# Patient Record
Sex: Female | Born: 1985 | Race: White | Hispanic: No | Marital: Married | Smoking: Never smoker
Health system: Southern US, Community
[De-identification: ages and names within clinical notes are randomized; demographics above are authoritative.]

## PROBLEM LIST (undated history)

## (undated) DIAGNOSIS — G40909 Epilepsy, unspecified, not intractable, without status epilepticus: Secondary | ICD-10-CM

## (undated) DIAGNOSIS — G43109 Migraine with aura, not intractable, without status migrainosus: Secondary | ICD-10-CM

## (undated) DIAGNOSIS — F988 Other specified behavioral and emotional disorders with onset usually occurring in childhood and adolescence: Secondary | ICD-10-CM

## (undated) DIAGNOSIS — N200 Calculus of kidney: Secondary | ICD-10-CM

## (undated) DIAGNOSIS — R6889 Other general symptoms and signs: Secondary | ICD-10-CM

## (undated) DIAGNOSIS — G44009 Cluster headache syndrome, unspecified, not intractable: Secondary | ICD-10-CM

## (undated) DIAGNOSIS — R569 Unspecified convulsions: Secondary | ICD-10-CM

## (undated) DIAGNOSIS — G43909 Migraine, unspecified, not intractable, without status migrainosus: Secondary | ICD-10-CM

## (undated) HISTORY — DX: Migraine with aura, not intractable, without status migrainosus: G43.109

## (undated) HISTORY — DX: Other specified behavioral and emotional disorders with onset usually occurring in childhood and adolescence: F98.8

## (undated) HISTORY — DX: Cluster headache syndrome, unspecified, not intractable: G44.009

## (undated) HISTORY — DX: Calculus of kidney: N20.0

## (undated) HISTORY — DX: Migraine, unspecified, not intractable, without status migrainosus: G43.909

## (undated) HISTORY — DX: Other general symptoms and signs: R68.89

## (undated) HISTORY — DX: Epilepsy, unspecified, not intractable, without status epilepticus: G40.909

## (undated) HISTORY — DX: Unspecified convulsions: R56.9

---
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
## 2008-04-21 ENCOUNTER — Other Ambulatory Visit: Admission: RE | Admit: 2008-04-21 | Discharge: 2008-04-21 | Payer: Self-pay | Admitting: Family Medicine

## 2008-06-26 ENCOUNTER — Emergency Department (HOSPITAL_COMMUNITY): Admission: EM | Admit: 2008-06-26 | Discharge: 2008-06-26 | Payer: Self-pay | Admitting: Family Medicine

## 2009-05-05 ENCOUNTER — Other Ambulatory Visit: Admission: RE | Admit: 2009-05-05 | Discharge: 2009-05-05 | Payer: Self-pay | Admitting: Family Medicine

## 2010-05-31 ENCOUNTER — Other Ambulatory Visit (HOSPITAL_COMMUNITY): Payer: Self-pay | Admitting: Neurology

## 2010-05-31 DIAGNOSIS — G43009 Migraine without aura, not intractable, without status migrainosus: Secondary | ICD-10-CM

## 2010-05-31 DIAGNOSIS — R6889 Other general symptoms and signs: Secondary | ICD-10-CM

## 2010-05-31 DIAGNOSIS — G40309 Generalized idiopathic epilepsy and epileptic syndromes, not intractable, without status epilepticus: Secondary | ICD-10-CM

## 2010-06-12 ENCOUNTER — Ambulatory Visit (HOSPITAL_COMMUNITY): Payer: Self-pay

## 2010-06-12 ENCOUNTER — Ambulatory Visit (HOSPITAL_COMMUNITY)
Admission: RE | Admit: 2010-06-12 | Discharge: 2010-06-12 | Disposition: A | Discharge status: Home / Self Care | Payer: Commercial Managed Care - PPO | Source: Ambulatory Visit | Attending: Neurology | Admitting: Neurology

## 2010-06-12 DIAGNOSIS — J329 Chronic sinusitis, unspecified: Secondary | ICD-10-CM | POA: Insufficient documentation

## 2010-06-12 DIAGNOSIS — R6889 Other general symptoms and signs: Secondary | ICD-10-CM

## 2010-06-12 DIAGNOSIS — G40309 Generalized idiopathic epilepsy and epileptic syndromes, not intractable, without status epilepticus: Secondary | ICD-10-CM | POA: Insufficient documentation

## 2010-06-12 DIAGNOSIS — G43009 Migraine without aura, not intractable, without status migrainosus: Secondary | ICD-10-CM | POA: Insufficient documentation

## 2010-06-21 ENCOUNTER — Other Ambulatory Visit (HOSPITAL_COMMUNITY): Payer: Self-pay | Admitting: Neurology

## 2010-06-22 ENCOUNTER — Ambulatory Visit (HOSPITAL_COMMUNITY)
Admission: RE | Admit: 2010-06-22 | Discharge: 2010-06-22 | Disposition: A | Discharge status: Home / Self Care | Payer: Commercial Managed Care - PPO | Source: Ambulatory Visit | Attending: Neurology | Admitting: Neurology

## 2010-06-22 ENCOUNTER — Other Ambulatory Visit (HOSPITAL_COMMUNITY): Payer: Self-pay | Admitting: Neurology

## 2010-06-22 DIAGNOSIS — R569 Unspecified convulsions: Secondary | ICD-10-CM | POA: Insufficient documentation

## 2010-06-22 DIAGNOSIS — R51 Headache: Secondary | ICD-10-CM | POA: Insufficient documentation

## 2010-06-22 DIAGNOSIS — R9409 Abnormal results of other function studies of central nervous system: Secondary | ICD-10-CM | POA: Insufficient documentation

## 2010-06-22 DIAGNOSIS — Z01812 Encounter for preprocedural laboratory examination: Secondary | ICD-10-CM | POA: Insufficient documentation

## 2010-06-22 LAB — CBC
MCHC: 34.7 g/dL (ref 30.0–36.0)
RDW: 13.8 % (ref 11.5–15.5)

## 2010-06-22 LAB — POCT I-STAT, CHEM 8
BUN: 13 mg/dL (ref 6–23)
Calcium, Ion: 1.21 mmol/L (ref 1.12–1.32)
Chloride: 109 mEq/L (ref 96–112)
Creatinine, Ser: 0.9 mg/dL (ref 0.4–1.2)

## 2010-06-22 LAB — APTT: aPTT: 31 seconds (ref 24–37)

## 2010-06-22 MED ORDER — IOHEXOL 300 MG/ML  SOLN
100.0000 mL | Freq: Once | INTRAMUSCULAR | Status: AC | PRN
  Administered 2010-06-22: 40 mL via INTRAVENOUS

## 2010-06-22 MED ORDER — IOHEXOL 300 MG/ML  SOLN
50.0000 mL | Freq: Once | INTRAMUSCULAR | Status: AC | PRN
  Administered 2010-06-22: 40 mL via INTRAVENOUS

## 2010-12-14 ENCOUNTER — Encounter: Payer: Self-pay | Admitting: Vascular Surgery

## 2010-12-23 ENCOUNTER — Emergency Department (HOSPITAL_COMMUNITY)
Admission: EM | Admit: 2010-12-23 | Discharge: 2010-12-24 | Disposition: A | Discharge status: Home / Self Care | Payer: 59 | Attending: Emergency Medicine | Admitting: Emergency Medicine

## 2010-12-23 DIAGNOSIS — G40909 Epilepsy, unspecified, not intractable, without status epilepticus: Secondary | ICD-10-CM | POA: Insufficient documentation

## 2010-12-23 DIAGNOSIS — R1032 Left lower quadrant pain: Secondary | ICD-10-CM | POA: Insufficient documentation

## 2010-12-24 ENCOUNTER — Emergency Department (HOSPITAL_COMMUNITY): Payer: 59

## 2010-12-24 LAB — URINALYSIS, ROUTINE W REFLEX MICROSCOPIC
Bilirubin Urine: NEGATIVE
Glucose, UA: NEGATIVE mg/dL
Nitrite: NEGATIVE
Specific Gravity, Urine: 1.018 (ref 1.005–1.030)
pH: 7.5 (ref 5.0–8.0)

## 2010-12-24 LAB — POCT PREGNANCY, URINE: Preg Test, Ur: NEGATIVE

## 2010-12-24 LAB — URINE MICROSCOPIC-ADD ON

## 2011-02-02 ENCOUNTER — Other Ambulatory Visit: Payer: Self-pay

## 2011-02-02 DIAGNOSIS — M79609 Pain in unspecified limb: Secondary | ICD-10-CM

## 2011-02-02 DIAGNOSIS — I83893 Varicose veins of bilateral lower extremities with other complications: Secondary | ICD-10-CM

## 2011-02-05 ENCOUNTER — Encounter: Payer: Self-pay | Admitting: Vascular Surgery

## 2011-02-06 ENCOUNTER — Encounter: Payer: Commercial Managed Care - PPO | Admitting: Vascular Surgery

## 2011-03-19 ENCOUNTER — Emergency Department (HOSPITAL_COMMUNITY)
Admission: EM | Admit: 2011-03-19 | Discharge: 2011-03-19 | Disposition: A | Discharge status: Home / Self Care | Payer: 59 | Source: Home / Self Care | Attending: Family Medicine | Admitting: Family Medicine

## 2011-03-19 ENCOUNTER — Encounter (HOSPITAL_COMMUNITY): Payer: Self-pay

## 2011-03-19 DIAGNOSIS — J069 Acute upper respiratory infection, unspecified: Secondary | ICD-10-CM

## 2011-03-19 MED ORDER — GUAIFENESIN-CODEINE 100-10 MG/5ML PO SYRP: 5.0000 mL | ORAL_SOLUTION | Freq: Three times a day (TID) | ORAL | Status: AC | PRN

## 2011-03-19 MED ORDER — AZITHROMYCIN 250 MG PO TABS: ORAL_TABLET | ORAL | Status: AC

## 2011-03-19 NOTE — ED Provider Notes (Signed)
History     CSN: [id number] Arrival date & time: 03/19/2011  2:45 PM   First MD Initiated Contact with Patient 03/19/11 1434      Chief Complaint  Patient presents with  . Cough    (Consider location/radiation/quality/duration/timing/severity/associated sxs/prior treatment) Patient is a 25 y.o. female presenting with cough. The history is provided by the patient.  Cough This is a new problem. The current episode started 2 days ago. The problem occurs constantly. The problem has been gradually worsening. The cough is productive of purulent sputum. There has been no fever. Associated symptoms include rhinorrhea and sore throat. She has tried nothing for the symptoms. She is not a smoker.    Past Medical History  Diagnosis Date  . Headaches, cluster   . Seizure disorder     History reviewed. No pertinent past surgical history.  History reviewed. No pertinent family history.  History  Substance Use Topics  . Smoking status: Never Smoker   . Smokeless tobacco: Not on file  . Alcohol Use: Yes    OB History    Grav Para Term Preterm Abortions TAB SAB Ect Mult Living                  Review of Systems  Constitutional: Negative.   HENT: Positive for congestion, sore throat, rhinorrhea and postnasal drip.   Respiratory: Positive for cough.     Allergies  Review of patient's allergies indicates no known allergies.  Home Medications   Current Outpatient Rx  Name Route Sig Dispense Refill  . TOPIRAMATE 100 MG PO TABS Oral Take 100 mg by mouth 2 (two) times daily.        BP 106/73  Pulse 68  Temp(Src) 98.2 F (36.8 C) (Oral)  Resp 10  SpO2 100%  Physical Exam  Constitutional: She appears well-developed and well-nourished.  HENT:  Head: Normocephalic.  Right Ear: External ear normal.  Left Ear: External ear normal.  Mouth/Throat: Oropharynx is clear and moist.  Eyes: Conjunctivae and EOM are normal. Pupils are equal, round, and reactive to light.  Neck:  Normal range of motion. Neck supple.  Cardiovascular: Normal rate, regular rhythm, normal heart sounds and intact distal pulses.   Pulmonary/Chest: Breath sounds normal.  Lymphadenopathy:    She has cervical adenopathy.  Skin: Skin is warm and dry.    ED Course  Procedures (including critical care time)  Labs Reviewed - No data to display No results found.   No diagnosis found.    MDM          Barkley Bruns, MD 03/19/11 [phone]

## 2011-03-19 NOTE — ED Notes (Signed)
C/o she has been coughing for 24-46 hours ; green-brown phlegm, minimal relief w OTC meds (zicam, cephacol, motrin)

## 2011-07-29 ENCOUNTER — Emergency Department (HOSPITAL_COMMUNITY)
Admission: EM | Admit: 2011-07-29 | Discharge: 2011-07-29 | Disposition: A | Discharge status: Home / Self Care | Payer: 59 | Source: Home / Self Care

## 2011-07-29 ENCOUNTER — Encounter (HOSPITAL_COMMUNITY): Payer: Self-pay

## 2011-07-29 DIAGNOSIS — R109 Unspecified abdominal pain: Secondary | ICD-10-CM

## 2011-07-29 LAB — POCT URINALYSIS DIP (DEVICE)
Leukocytes, UA: NEGATIVE
Nitrite: NEGATIVE
Protein, ur: NEGATIVE mg/dL
Urobilinogen, UA: 0.2 mg/dL (ref 0.0–1.0)
pH: 6.5 (ref 5.0–8.0)

## 2011-07-29 LAB — WET PREP, GENITAL

## 2011-07-29 MED ORDER — HYDROCODONE-ACETAMINOPHEN 5-325 MG PO TABS: 1.0000 | ORAL_TABLET | Freq: Four times a day (QID) | ORAL | Status: AC | PRN

## 2011-07-29 MED ORDER — HYDROXYZINE HCL 25 MG PO TABS: 25.0000 mg | ORAL_TABLET | Freq: Four times a day (QID) | ORAL | Status: AC | PRN

## 2011-07-29 NOTE — ED Provider Notes (Signed)
History     CSN: [id number]  Arrival date & time 07/29/11  7829   None     Chief Complaint  Patient presents with  . Abdominal Pain    2 day hx of abdominal pain.  Have had diarrhea x2.  LLQ pain at first now has moved below navel.      (Consider location/radiation/quality/duration/timing/severity/associated sxs/prior treatment) HPI Comments: Gradual onset abd pain on 07/27/11.  Was initially localized to LLQ.  Over time has gradually spread to entire middle/transverse abd across umbilicus.  Cannot localize at this time.  Is finished menses now, period was normal for pt, still has brown spotting.  No unusual vag bleeding per pt's usual.  One episode diarrhea yesterday; episode of diarrhea today was mucous-y.  No hx abd surgery.  No fever, no emesis.   Patient is a 26 y.o. female presenting with abdominal pain. The history is provided by the patient.  Abdominal Pain The primary symptoms of the illness include abdominal pain and diarrhea. The primary symptoms of the illness do not include fever, shortness of breath, nausea, vomiting, dysuria or vaginal discharge. The current episode started 2 days ago. The onset of the illness was gradual. The problem has been gradually worsening.  The abdominal pain is located in the periumbilical region, LLQ and RLQ. The abdominal pain does not radiate. The severity of the abdominal pain is 3/10 (pain is 2-3 when at rest, 10 with gentle palpation). The abdominal pain is relieved by nothing.  The diarrhea began yesterday. The diarrhea is watery and mucous. The diarrhea occurs once per day.  The patient states that she believes she is currently not pregnant. Additional symptoms associated with the illness include back pain. Symptoms associated with the illness do not include chills or diaphoresis. Significant associated medical issues do not include inflammatory bowel disease, diabetes, gallstones or diverticulitis.    Past Medical History  Diagnosis Date  .  Headaches, cluster   . Seizure disorder     History reviewed. No pertinent past surgical history.  Family History  Problem Relation Age of Onset  . Cancer Other   . Heart failure Other     History  Substance Use Topics  . Smoking status: Never Smoker   . Smokeless tobacco: Never Used  . Alcohol Use: Yes     occational    OB History    Grav Para Term Preterm Abortions TAB SAB Ect Mult Living                  Review of Systems  Constitutional: Negative for fever, chills, diaphoresis and appetite change.  Respiratory: Negative for shortness of breath.   Gastrointestinal: Positive for abdominal pain and diarrhea. Negative for nausea, vomiting and blood in stool.  Genitourinary: Negative for dysuria, flank pain, vaginal discharge, vaginal pain and pelvic pain.  Musculoskeletal: Positive for back pain.       Slight low back pain since the abd pain began  Skin: Negative.     Allergies  Review of patient's allergies indicates no known allergies.  Home Medications   Current Outpatient Rx  Name Route Sig Dispense Refill  . HYDROCODONE-ACETAMINOPHEN 5-325 MG PO TABS Oral Take 1-2 tablets by mouth every 6 (six) hours as needed for pain. 20 tablet 0  . HYDROXYZINE HCL 25 MG PO TABS Oral Take 1 tablet (25 mg total) by mouth every 6 (six) hours as needed for itching. 20 tablet 0  . TOPIRAMATE 100 MG PO TABS Oral Take 100  mg by mouth 2 (two) times daily.        BP 134/79  Pulse 82  Temp(Src) 98.3 F (36.8 C) (Oral)  Resp 14  SpO2 100%  LMP 07/21/2011  Physical Exam  Constitutional: She appears well-developed and well-nourished. No distress.  Cardiovascular: Normal rate and regular rhythm.   Pulmonary/Chest: Effort normal and breath sounds normal.  Abdominal: Normal appearance and bowel sounds are normal. There is no hepatosplenomegaly. There is tenderness in the right lower quadrant, periumbilical area and left lower quadrant. There is no rigidity, no rebound, no guarding,  no CVA tenderness, no tenderness at McBurney's point and negative Murphy's sign.    Genitourinary: Cervix exhibits no motion tenderness. Right adnexum displays no mass and no tenderness. Left adnexum displays no mass and no tenderness. No tenderness around the vagina.       Small amount mucous-y, brown/old blood discharge from cervix c/w end of menses spotting/discharge. Unable to palpate retroverted uterus.     ED Course  Procedures (including critical care time)  Labs Reviewed  POCT URINALYSIS DIP (DEVICE) - Abnormal; Notable for the following:    Hgb urine dipstick SMALL (*)    All other components within normal limits  POCT PREGNANCY, URINE  GC/CHLAMYDIA PROBE AMP, GENITAL  WET PREP, GENITAL   No results found.   1. Abdominal pain       MDM  Discussed options with pt.  She is not febrile, does not appear toxic or in distress except with abd palpation.  Offered transfer to ED, or d/c home with pain medicine and f/u with pcp in the AM.  Pt elects to f/u with pcp.  Reviewed with pt reasons for going to ER. Pt verbalizes understanding. Rx hydrocodone with atarax as pt states hydrocodone makes her itch.         Cathlyn Parsons, NP 07/29/11 1025

## 2011-07-29 NOTE — Discharge Instructions (Signed)
If your symptoms worsen, you develop a fever, or your pain becomes localized, go to the ER. Otherwise, follow up with Dr. Parke Simmers tomorrow for further testing.    Abdominal Pain Abdominal pain can be caused by many things. Your caregiver decides the seriousness of your pain by an examination and possibly blood tests and X-rays. Many cases can be observed and treated at home. Most abdominal pain is not caused by a disease and will probably improve without treatment. However, in many cases, more time must pass before a clear cause of the pain can be found. Before that point, it may not be known if you need more testing, or if hospitalization or surgery is needed. HOME CARE INSTRUCTIONS   Do not take laxatives unless directed by your caregiver.   Take pain medicine only as directed by your caregiver.   Only take over-the-counter or prescription medicines for pain, discomfort, or fever as directed by your caregiver.   Try a clear liquid diet (broth, tea, or water) for as long as directed by your caregiver. Slowly move to a bland diet as tolerated.  SEEK IMMEDIATE MEDICAL CARE IF:   The pain does not go away.   You have a fever.   You keep throwing up (vomiting).   The pain is felt only in portions of the abdomen. Pain in the right side could possibly be appendicitis. In an adult, pain in the left lower portion of the abdomen could be colitis or diverticulitis.   You pass bloody or black tarry stools.  MAKE SURE YOU:   Understand these instructions.   Will watch your condition.   Will get help right away if you are not doing well or get worse.  Document Released: 01/10/2005 Document Revised: 03/22/2011 Document Reviewed: 11/19/2007 [facility] Patient Information 2012 [place], [place].

## 2011-07-29 NOTE — ED Notes (Signed)
2 day hx of abdominal pain.  Have had diarrhea x2.  LLQ pain at first now has moved below navel.  Pt. States she just finished her period, so this pain is not like cramps from period.

## 2011-07-30 ENCOUNTER — Other Ambulatory Visit: Payer: Self-pay | Admitting: Family Medicine

## 2011-07-30 ENCOUNTER — Ambulatory Visit
Admission: RE | Admit: 2011-07-30 | Discharge: 2011-07-30 | Disposition: A | Discharge status: Home / Self Care | Payer: 59 | Source: Ambulatory Visit | Attending: Family Medicine | Admitting: Family Medicine

## 2011-07-30 MED ORDER — IOHEXOL 300 MG/ML  SOLN
30.0000 mL | Freq: Once | INTRAMUSCULAR | Status: AC | PRN
  Administered 2011-07-30: 30 mL via ORAL

## 2011-07-30 MED ORDER — IOHEXOL 300 MG/ML  SOLN
100.0000 mL | Freq: Once | INTRAMUSCULAR | Status: AC | PRN
  Administered 2011-07-30: 100 mL via INTRAVENOUS

## 2011-08-06 NOTE — ED Provider Notes (Signed)
Medical screening examination/treatment/procedure(s) were performed by non-physician practitioner and as supervising physician I was immediately available for consultation/collaboration.  Eashan Schipani M. MD   Spero Gunnels M Shadrack Brummitt, MD 08/06/11 1419 

## 2012-04-15 ENCOUNTER — Encounter: Payer: Self-pay | Admitting: *Deleted

## 2012-04-15 ENCOUNTER — Emergency Department
Admission: EM | Admit: 2012-04-15 | Discharge: 2012-04-15 | Disposition: A | Discharge status: Home / Self Care | Payer: 59 | Source: Home / Self Care | Attending: Family Medicine | Admitting: Family Medicine

## 2012-04-15 DIAGNOSIS — R059 Cough, unspecified: Secondary | ICD-10-CM

## 2012-04-15 DIAGNOSIS — R509 Fever, unspecified: Secondary | ICD-10-CM

## 2012-04-15 DIAGNOSIS — R05 Cough: Secondary | ICD-10-CM

## 2012-04-15 DIAGNOSIS — J029 Acute pharyngitis, unspecified: Secondary | ICD-10-CM

## 2012-04-15 DIAGNOSIS — J111 Influenza due to unidentified influenza virus with other respiratory manifestations: Secondary | ICD-10-CM

## 2012-04-15 LAB — POCT RAPID STREP A (OFFICE): Rapid Strep A Screen: NEGATIVE

## 2012-04-15 MED ORDER — OSELTAMIVIR PHOSPHATE 75 MG PO CAPS: 75.0000 mg | ORAL_CAPSULE | Freq: Two times a day (BID) | ORAL | Status: DC

## 2012-04-15 MED ORDER — BENZONATATE 200 MG PO CAPS: 200.0000 mg | ORAL_CAPSULE | Freq: Every day | ORAL | Status: DC

## 2012-04-15 NOTE — ED Provider Notes (Signed)
History     CSN: [id number]  Arrival date & time 04/15/12  1739   First MD Initiated Contact with Patient 04/15/12 1806      Chief Complaint  Patient presents with  . Sore Throat  . Fever  . Cough     HPI Comments: Patient complains of onset of flu-like symptoms two days ago including sore throat, fever, body aches and dry cough.  No improvement with ibuprofen and mucinex.  She had a flu shot in October.  The history is provided by the patient.    Past Medical History  Diagnosis Date  . Headaches, cluster   . Seizure disorder     History reviewed. No pertinent past surgical history.  Family History  Problem Relation Age of Onset  . Cancer Other   . Heart failure Other     History  Substance Use Topics  . Smoking status: Never Smoker   . Smokeless tobacco: Never Used  . Alcohol Use: Yes     Comment: occational    OB History    Grav Para Term Preterm Abortions TAB SAB Ect Mult Living                  Review of Systems + sore throat + cough No pleuritic pain No wheezing + hoarseness + nasal congestion + post-nasal drainage No sinus pain/pressure No itchy/red eyes No earache No hemoptysis No SOB + fever, + chills No nausea No vomiting No abdominal pain No diarrhea No urinary symptoms No skin rashes + fatigue + myalgias + headache Used OTC meds without relief  Allergies  Review of patient's allergies indicates no known allergies.  Home Medications   Current Outpatient Rx  Name  Route  Sig  Dispense  Refill  . BENZONATATE 200 MG PO CAPS   Oral   Take 1 capsule (200 mg total) by mouth at bedtime. Take as needed for cough   12 capsule   0   . OSELTAMIVIR PHOSPHATE 75 MG PO CAPS   Oral   Take 1 capsule (75 mg total) by mouth every 12 (twelve) hours.   10 capsule   0   . TOPIRAMATE 100 MG PO TABS   Oral   Take 100 mg by mouth 2 (two) times daily.             BP 102/68  Pulse 84  Temp 99.1 F (37.3 C) (Oral)  Resp 16  Ht 5'  7" (1.702 m)  Wt 163 lb (73.936 kg)  BMI 25.53 kg/m2  SpO2 99%  LMP 03/25/2012  Physical Exam Nursing notes and Vital Signs reviewed. Appearance:  Patient appears healthy, stated age, and in no acute distress Eyes:  Pupils are equal, round, and reactive to light and accomodation.  Extraocular movement is intact.  Conjunctivae are not inflamed  Ears:  Canals normal.  Tympanic membranes normal.  Nose:  Mildly congested turbinates.  No sinus tenderness.   Pharynx:  Mildly erythematous Neck:  Supple.  Tender shotty anterior/posterior nodes are palpated bilaterally  Lungs:  Clear to auscultation.  Breath sounds are equal.  Heart:  Regular rate and rhythm without murmurs, rubs, or gallops.  Abdomen:  Nontender without masses or hepatosplenomegaly.  Bowel sounds are present.  No CVA or flank tenderness.  Extremities:  No edema.  No calf tenderness Skin:  No rash present.   ED Course  Procedures none   Labs Reviewed  POCT RAPID STREP A (OFFICE) negative      1.  Influenza-like illness       MDM  Begin Tamiflu.  Prescription written for Benzonatate [facility]) to take at bedtime for night-time cough.   Take plain Mucinex (guaifenesin) twice daily for cough and congestion. May add Sudafed for sinus congestion.  Increase fluid intake, rest. May use Afrin nasal spray (or generic oxymetazoline) twice daily for about 5 days.  Also recommend using saline nasal spray several times daily and saline nasal irrigation (AYR is a common brand) Stop all antihistamines for now, and other non-prescription cough/cold preparations. May take Ibuprofen 200mg , 4 tabs every 8 hours with food for fever, body aches, etc. Follow-up with family doctor if not improving about 5 days.          Lattie Haw, MD 04/15/12 [phone]

## 2012-04-15 NOTE — ED Notes (Signed)
Patient c/o sore throat, fever, body aches and dry cough x 2-3 days. Taken IBF, and mucinex otc

## 2012-08-25 ENCOUNTER — Telehealth: Payer: Self-pay | Admitting: Neurology

## 2012-08-25 MED ORDER — SUMATRIPTAN SUCCINATE 100 MG PO TABS: 100.0000 mg | ORAL_TABLET | ORAL | Status: DC | PRN

## 2012-08-25 NOTE — Telephone Encounter (Signed)
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  Tammy Cameron refills via [facility]

## 2012-08-29 ENCOUNTER — Telehealth: Payer: Self-pay | Admitting: Neurology

## 2012-09-03 ENCOUNTER — Telehealth: Payer: Self-pay | Admitting: Neurology

## 2012-09-15 ENCOUNTER — Telehealth: Payer: Self-pay | Admitting: Neurology

## 2012-10-21 ENCOUNTER — Ambulatory Visit: Payer: Self-pay | Admitting: Neurology

## 2012-11-11 IMAGING — CT CT ABD-PELV W/ CM
2 of 4 series · 17 of 46 positions shown, 19 images · IV contrast (READICAT/WATER & [ID] OMNI 300)
Comparison: [HOSPITAL] CT abdomen pelvis dated 12/24/2010

CLINICAL DATA: Right lower quadrant pain/tenderness

CT ABDOMEN AND PELVIS WITH CONTRAST
TECHNIQUE: Multidetector CT imaging of the abdomen and pelvis was
performed following the standard protocol during bolus
administration of intravenous contrast.
Contrast: 30mL OMNIPAQUE IOHEXOL 300 MG/ML  SOLN, 100mL OMNIPAQUE
IOHEXOL 300 MG/ML  SOLN

[Series 2: abd/pelvis with · axial · 0.70mm/px · z∈[-382,+18]mm · 14 of 88 slices shown, 16 images]
[im 4/88  soft-tissue]
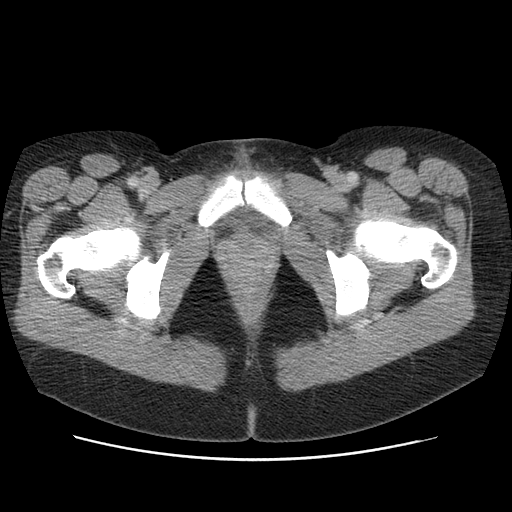
[im 4/88  bone]
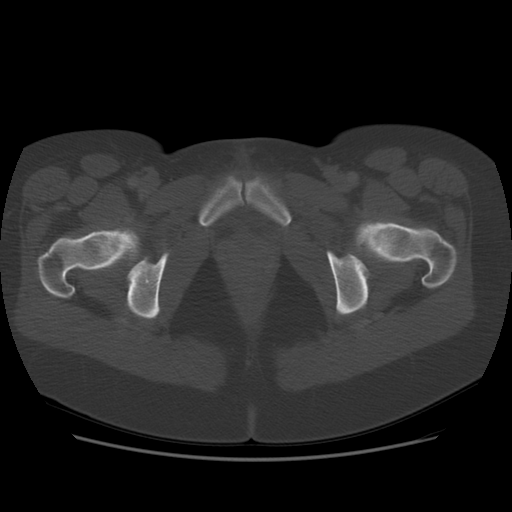
[im 11/88  soft-tissue]
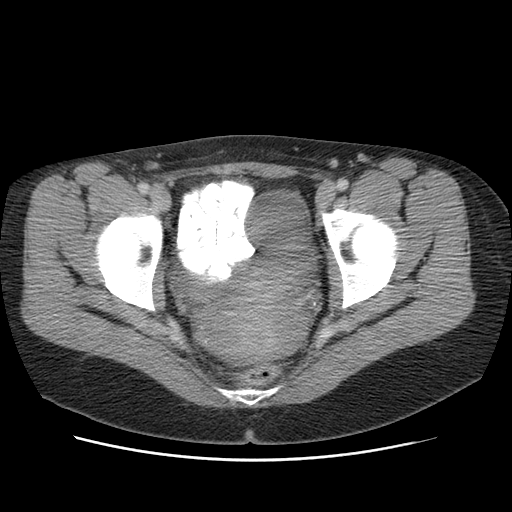
[im 19/88  soft-tissue]
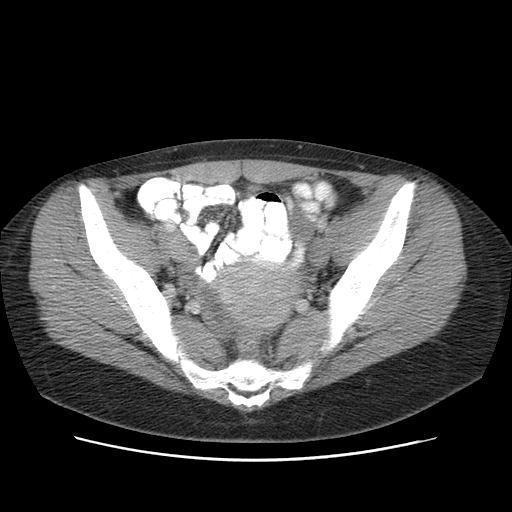
[im 22/88  soft-tissue]
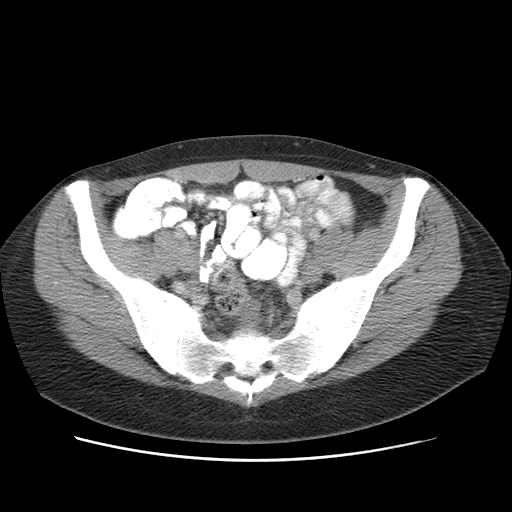
[im 30/88  soft-tissue]
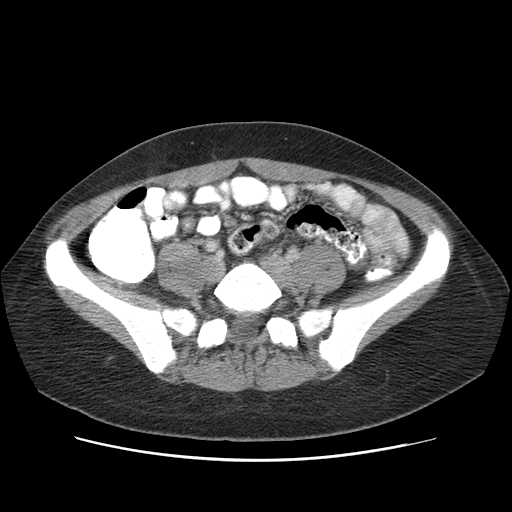
[im 37/88  soft-tissue]
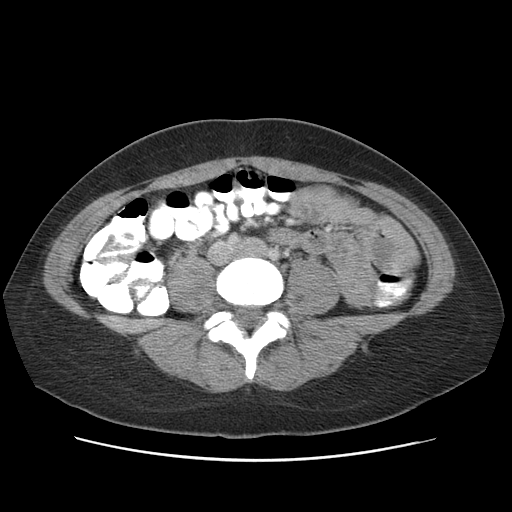
[im 40/88  soft-tissue]
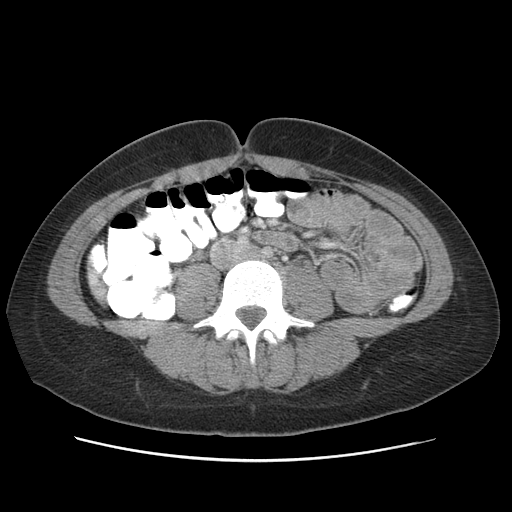
[im 48/88  soft-tissue]
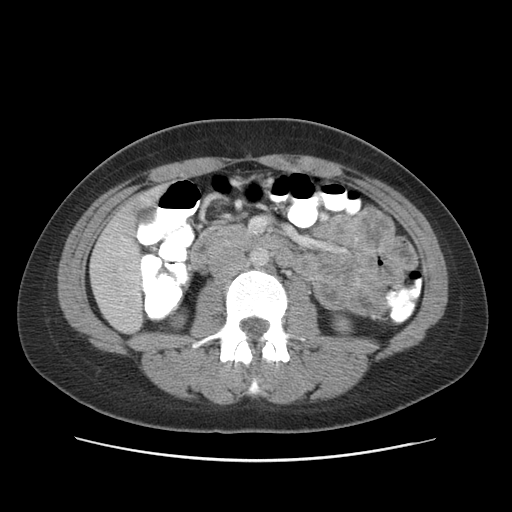
[im 51/88  soft-tissue]
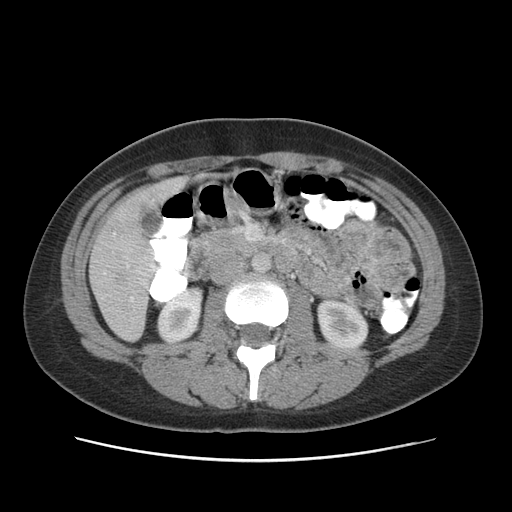
[im 51/88  bone]
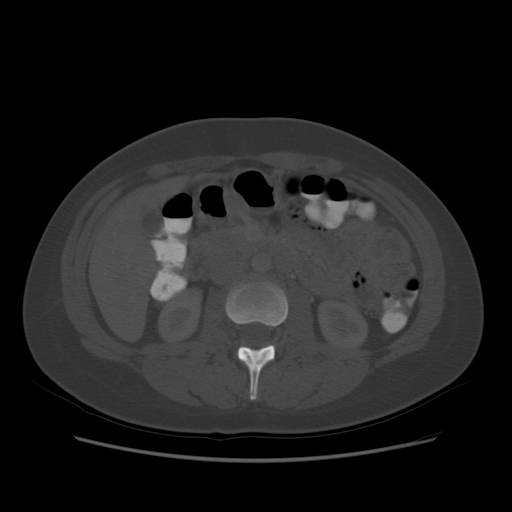
[im 59/88  soft-tissue]
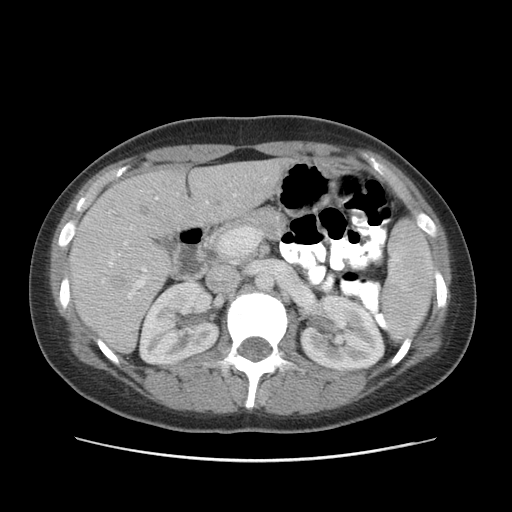
[im 66/88  soft-tissue]
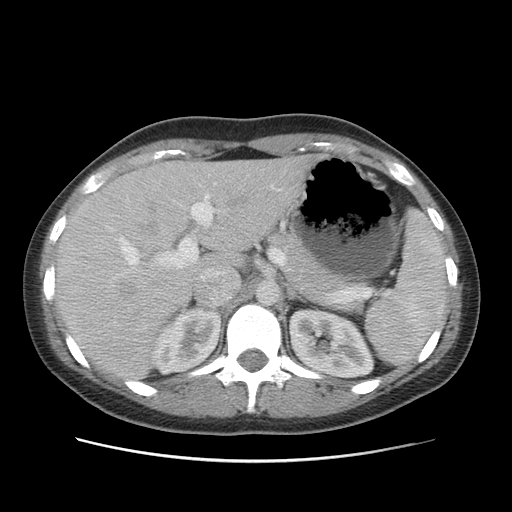
[im 69/88  soft-tissue]
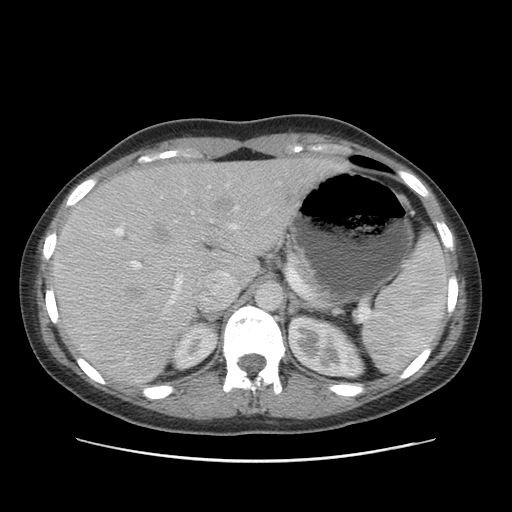
[im 77/88  soft-tissue]
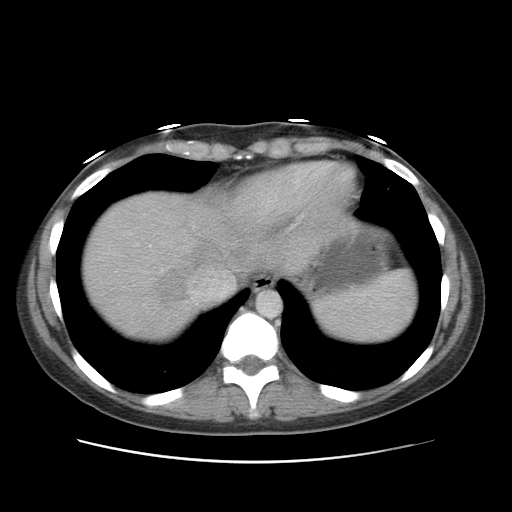
[im 84/88  soft-tissue]
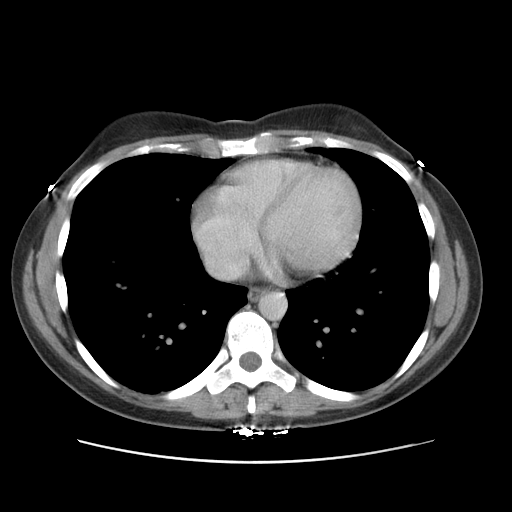

[Series 400: coronal · coronal · 0.97mm/px · 3 of 106 slices shown]
[im 36/106  soft-tissue]
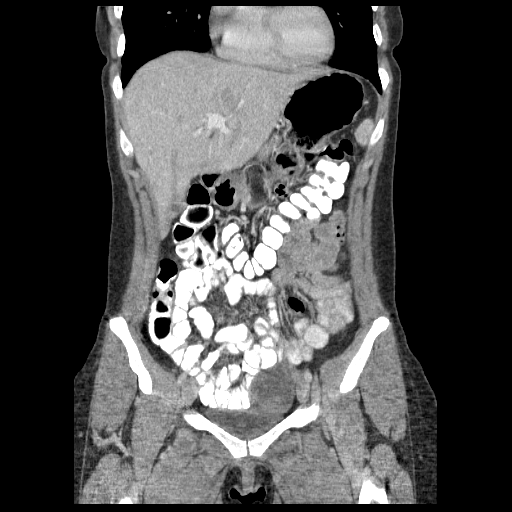
[im 47/106  soft-tissue]
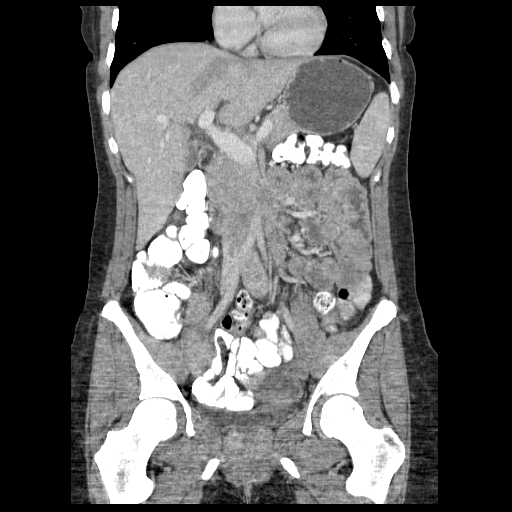
[im 59/106  soft-tissue]
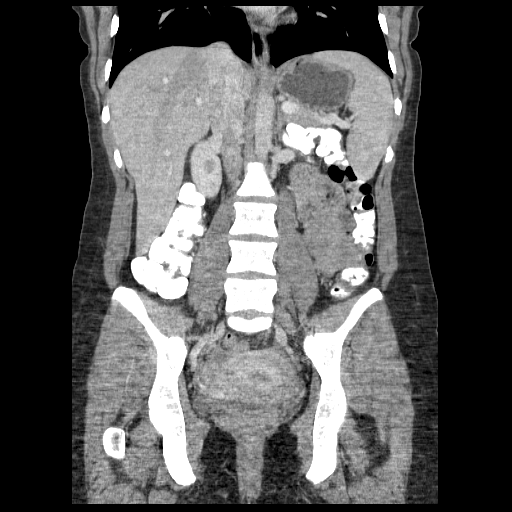

[17 of 46 positions shown; findings below may reference images not displayed]

FINDINGS: Lung bases are clear.

Liver, spleen, pancreas, and adrenal glands are within normal
limits.

Gallbladder is unremarkable.  No intrahepatic or extrahepatic
ductal dilatation.

Kidneys are within normal limits.  No hydronephrosis.

No evidence of bowel obstruction.  Normal appendix.

No evidence of abdominal aortic aneurysm.

No suspicious abdominopelvic lymphadenopathy.

Small volume complex pelvic ascites/hemorrhage, likely physiologic.

Uterus and right ovary are unremarkable.  4.8 x 4.4 x 5.4 cm
complex/hemorrhagic left ovarian cyst, new.

Bladder is within normal limits.

Superior endplate Schmorl's nodes from T11-L1.
IMPRESSION: Normal appendix.  No evidence of bowel obstruction.

5.4 cm complex/hemorrhagic left ovarian cyst, likely physiologic.
Follow-up pelvic ultrasound can be performed in 6-10 weeks as
clinically warranted.

## 2012-12-03 ENCOUNTER — Ambulatory Visit: Payer: Self-pay | Admitting: Neurology

## 2013-01-13 ENCOUNTER — Encounter: Payer: Self-pay | Admitting: Neurology

## 2013-01-13 ENCOUNTER — Ambulatory Visit (INDEPENDENT_AMBULATORY_CARE_PROVIDER_SITE_OTHER): Payer: 59 | Admitting: Neurology

## 2013-01-13 VITALS — BP 85/55 | HR 75 | Resp 16 | Ht <= 58 in | Wt 165.0 lb

## 2013-01-13 DIAGNOSIS — G43109 Migraine with aura, not intractable, without status migrainosus: Secondary | ICD-10-CM

## 2013-01-13 HISTORY — DX: Migraine with aura, not intractable, without status migrainosus: G43.109

## 2013-01-13 MED ORDER — PROPRANOLOL HCL ER 60 MG PO CP24: 60.0000 mg | ORAL_CAPSULE | Freq: Every day | ORAL | Status: DC

## 2013-01-13 NOTE — Progress Notes (Signed)
[facility]  Provider:  Melvyn Novas, M D  Referring Provider: Renaye Rakers, MD Primary Care Physician:  Geraldo Pitter, MD  Chief Complaint  Patient presents with  . Follow-up    formerly  Dr Vernie Ammons 10, MOCO-29/30    HPI:  Tammy Cameron is a 27 y.o. female  Is seen here as a referral/ revisit  from Dr. Parke Simmers for Migraine Headaches.  The patient had been treated with Topiramate for headche prophylaxis. The patient had a single Seizure at age 55 , with an abnormal EEG-  and the TPM was meant  to help with that as well. Than the patient developed kidney stones, and the medication  had to be discontinued. Than she has lost several days at work 22 migraines breaking through. She also noticed that the first month after being taken off topiramate was of fairly good and easy 1. The migraines seem to have staggered and have come in greater frequency and intensity of her symptoms as prophylactic medication was discontinued. There has been no other prophylactic prescribe sulfa but the patient has been taking Imitrex to treat the acute migraines. She sometimes needs to of Imitrex by mouth to control her migraines. Plan the patient was treated with Topamax she also developed another well-known side effect, that of cognitive impairment. She felt that her short-term memory was impaired and she couldn't multitask but she wasn't able to pay as much attention felt at times confused.  Dr. Sandria Manly performed a Mini-Mental test during  his last visit in August 2013;  the score was 29/30 points/ the animal fluency was 11 points/ clock drawing was for a 4 points.  Today a MOCA tests was performed here at the office, 29-30 points.  The patient feels that her cognitive side effects have been resolved she still has some feeling of inadequate memory - The multitasking is especially troublesome. 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    The patient has by now 5-8 migraines a month, she reduced coffee, she has hydrated consciously.  She has visual auras , nausea and vomiting.  Photophobia and phonophobia.  The patient needs another prophylatic medication. We will discuss beta blockers; she has low blood pressure , no asthma and no history of fainting. She would like to try that.   alternatives: ca channel blockers- not indicated for her kind of migraines.  , zonisamide, a possibility-   depakote - not because weight gain, and since this  patient is of childbaring age.   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      Review of Systems: Out of a complete 14 system review, the patient complains of only the following symptoms, and all other reviewed systems are negative. Migraine, 8/week, auras , nausea,  Hours of sleep in daytime 6-7.5 .    History   Social History  . Marital Status: Married    Spouse Name: Joey    Number of Children: N/A  . Years of Education: college   Occupational History  .  [employer]    Nurse   Social History Main Topics  . Smoking status: Never Smoker   . Smokeless tobacco: Never Used  . Alcohol Use: Yes     Comment: occasionally-2 drinks monthly  . Drug Use: No  . Sexual Activity: Yes    Birth Control/ Protection: IUD   Other  Topics Concern  . Not on file   Social History Narrative   Patient consumes one cup of  caffeine daily. Patient is left handed, resides in home with husband, no children.    Family History  Problem Relation Age of Onset  . Cancer Other   . Heart failure Other   . Cancer Maternal Grandmother     breast  . Heart disease Maternal Grandfather   . Hypertension Maternal Grandfather     Past Medical History  Diagnosis Date  . Headaches, cluster   . Seizure disorder   . Seizures   . Migraine   . Kidney stones   . Calculus of kidney   . Attention deficit disorder without mention of hyperactivity   . Other general  symptoms(780.99)     Past Surgical History  Procedure Laterality Date  . Wisdom tooth extraction  2002    Current Outpatient Prescriptions  Medication Sig Dispense Refill  . Multiple Vitamin (MULTIVITAMIN) capsule Take 1 capsule by mouth daily. occasionally      . SUMAtriptan (IMITREX) 100 MG tablet Take 1 tablet (100 mg total) by mouth as needed for migraine (May repeat in 2 hours if no relief.  Max 4 tabs weekly).  9 tablet  3   No current facility-administered medications for this visit.    Allergies as of 01/13/2013  . (No Known Allergies)    Vitals: BP 85/55  Pulse 75  Resp 16  Ht 4\' 10"  (1.473 m)  Wt 165 lb (74.844 kg)  BMI 34.49 kg/m2 Last Weight:  Wt Readings from Last 1 Encounters:  01/13/13 165 lb (74.844 kg)   Last Height:   Ht Readings from Last 1 Encounters:  01/13/13 4\' 10"  (1.473 m)    Physical exam:  General: The patient is awake, alert and appears not in acute distress. The patient is well groomed. Head: Normocephalic, atraumatic. Neck is supple. Mallampati 2, neck circumference: 14 inches, no nasal deviation,  Cardiovascular:  Regular rate and rhythm , without  murmurs or carotid bruit, and without distended neck veins. Respiratory: Lungs are clear to auscultation. Skin:  Without evidence of edema, or rash Trunk: BMI is normal .  Neurologic exam : The patient is awake and alert, oriented to place and time.  Memory subjective  described as intact. There is a  Subjective feeling of  a reduced  attention span & concentration ability.  Speech is fluent without   dysarthria, dysphonia or aphasia. Mood and affect are appropriate.  Cranial nerves: Pupils are equal and briskly reactive to light. Funduscopic exam without  evidence of pallor or edema. Extraocular movements  in vertical and horizontal planes intact and without nystagmus. Visual fields by finger perimetry are intact. Hearing to finger rub intact.  Facial sensation intact to fine touch. Facial  motor strength is symmetric and tongue and uvula move midline.  Motor exam:   Normal tone and normal muscle bulk and symmetric normal strength in all extremities.  Sensory:  Fine touch, pinprick and vibration were tested in all extremities. Proprioception is  normal.  Coordination: Rapid alternating movements in the fingers/hands is tested and normal. Finger-to-nose maneuver  without evidence of ataxia, dysmetria or tremor.  Gait and station: Patient walks without assistive device , strength within normal limits. Stance is stable and normal. Tandem gait is  unfragmented. Romberg testing is normal.  Deep tendon reflexes: in the  upper and lower extremities are symmetric and intact. Babinski maneuver  downgoing.   Assessment:  After physical  and neurologic examination, review of laboratory studies, imaging, neurophysiology testing and pre-existing records, assessment is that of  Migraines with aura , break through after TPM was discontinued.  8 a month require prophylactic medications- start Propranolol  XR a day.  After we  Have had 90 days to review HA progress, we can evaluate for ADD/ ADHD.  Patient was a daydreamer , but never hyperactive - doubt ADHD ,  [place]. Dr Mcdermott referral .   Plan:  Treatment plan and additional workup :  propanolol XR .  Rv in 4 month with Np or me.

## 2013-01-13 NOTE — Patient Instructions (Signed)
Recurrent Migraine Headache  A migraine headache is an intense, throbbing pain on one or both sides of your head. Recurrent migraines keep coming back. A migraine can last for 30 minutes to several hours.  CAUSES   The exact cause of a migraine headache is not always known. However, a migraine may be caused when nerves in the brain become irritated and release chemicals that cause inflammation. This causes pain.   SYMPTOMS    Pain on one or both sides of your head.   Pulsating or throbbing pain.   Severe pain that prevents daily activities.   Pain that is aggravated by any physical activity.   Nausea, vomiting, or both.   Dizziness.   Pain with exposure to bright lights, loud noises, or activity.   General sensitivity to bright lights, loud noises, or smells.  Before you get a migraine, you may get warning signs that a migraine is coming (aura). An aura may include:   Seeing flashing lights.   Seeing bright spots, halos, or zig-zag lines.   Having tunnel vision or blurred vision.   Having feelings of numbness or tingling.   Having trouble talking.   Having muscle weakness.  MIGRAINE TRIGGERS  Examples of triggers of migraine headaches include:    Alcohol.   Smoking.   Stress.   Menstruation.   Aged cheeses.   Foods or drinks that contain nitrates, glutamate, aspartame, or tyramine.   Lack of sleep.   Chocolate.   Caffeine.   Hunger.   Physical exertion.   Fatigue.   Medicines used to treat chest pain (nitroglycerine), birth control pills, estrogen, and some blood pressure medicines.  DIAGNOSIS   A recurrent migraine headache is often diagnosed based on:   Symptoms.   Physical examination.   A CT scan or MRI of your head.  TREATMENT   Medicines may be given for pain and nausea. Medicines can also be given to help prevent recurrent migraines.  HOME CARE INSTRUCTIONS   Only take over-the-counter or prescription medicines for pain or discomfort as directed by your caregiver. The use of  long-term narcotics is not recommended.   Lie down in a dark, quiet room when you have a migraine.   Keep a journal to find out what may trigger your migraine headaches. For example, write down:   What you eat and drink.   How much sleep you get.   Any change to your diet or medicines.   Limit alcohol consumption.   Quit smoking if you smoke.   Get 7 to 9 hours of sleep, or as recommended by your caregiver.   Limit stress.   Keep lights dim if bright lights bother you and make your migraines worse.  SEEK MEDICAL CARE IF:    You do not get relief from the medicines given to you.   You have a recurrence of pain.  SEEK IMMEDIATE MEDICAL CARE IF:   Your migraine becomes severe.   You have a fever.   You have a stiff neck.   You have loss of vision.   You have muscular weakness or loss of muscle control.   You start losing your balance or have trouble walking.   You feel faint or pass out.   You have severe symptoms that are different from your first symptoms.  MAKE SURE YOU:    Understand these instructions.   Will watch your condition.   Will get help right away if you are not doing well or get worse.    Document Released: 12/26/2000 Document Revised: 06/25/2011 Document Reviewed: 03/23/2011  [employer] Patient Information 2014 [employer].

## 2013-05-13 ENCOUNTER — Telehealth: Payer: Self-pay | Admitting: Neurology

## 2013-05-13 NOTE — Telephone Encounter (Signed)
Patient would like a call back regarding why her follow up appointment for metoprolol has to be pushed so far out. She has been rescheduled for May. Please call to advise.

## 2013-05-14 ENCOUNTER — Ambulatory Visit: Payer: 59 | Admitting: Nurse Practitioner

## 2013-05-14 NOTE — Telephone Encounter (Signed)
Called patient and left VM message for sooner appt

## 2013-05-14 NOTE — Telephone Encounter (Signed)
Patient rec'd call for appt but could not come in ,requesting to be placed on cancellation list

## 2013-06-05 NOTE — Telephone Encounter (Signed)
Pt came in for her visit closing encounter °

## 2013-07-15 ENCOUNTER — Other Ambulatory Visit: Payer: Self-pay | Admitting: Neurology

## 2013-08-28 ENCOUNTER — Encounter: Payer: Self-pay | Admitting: Nurse Practitioner

## 2013-08-28 ENCOUNTER — Encounter (INDEPENDENT_AMBULATORY_CARE_PROVIDER_SITE_OTHER): Payer: Self-pay

## 2013-08-28 ENCOUNTER — Ambulatory Visit (INDEPENDENT_AMBULATORY_CARE_PROVIDER_SITE_OTHER): Payer: 59 | Admitting: Nurse Practitioner

## 2013-08-28 VITALS — BP 93/62 | HR 67 | Ht 68.0 in | Wt 166.0 lb

## 2013-08-28 DIAGNOSIS — G43109 Migraine with aura, not intractable, without status migrainosus: Secondary | ICD-10-CM

## 2013-08-28 NOTE — Patient Instructions (Signed)
Continue Inderal at current dose Continue Imitrex acutely Followup in 6 months

## 2013-08-28 NOTE — Progress Notes (Signed)
[facility]  PATIENT: Tammy Cameron DOB: 08/01/1985   REASON FOR VISIT: Followup for migraines   HISTORY OF PRESENT ILLNESS: Tammy Cameron, 28 year old female returns for followup. She has a history of migraines and is currently on Inderal LA 60 mg with excellent control of her headaches. She has not missed any work recently. She has a used 1 Imitrex since last seen by Dr. Vickey Hugerohmeier 01/13/2013. She is aware of her migraine triggers and tries  to avoid those. She returns for reevaluation.    HISTORY: The patient had been treated with Topiramate for headche prophylaxis. The patient had a single Seizure at age 28 , with an abnormal EEG-  and the TPM was meant to help with that as well. Than the patient developed kidney stones, and the medication had to be discontinued. Than she has lost several days at work 22 migraines breaking through. She also noticed that the first month after being taken off topiramate was of fairly good and easy 1. The migraines seem to have staggered and have come in greater frequency and intensity of her symptoms as prophylactic medication was discontinued. There has been no other prophylactic prescribe sulfa but the patient has been taking Imitrex to treat the acute migraines. She sometimes needs to of Imitrex by mouth to control her migraines.  Plan the patient was treated with Topamax she also developed another well-known side effect, that of cognitive impairment. She felt that her short-term memory was impaired and she couldn't multitask but she wasn't able to pay as much attention felt at times confused.  Dr. Sandria ManlyLove performed a Mini-Mental test during his last visit in August 2013; the score was 29/30 points/ the animal fluency was 11 points/ clock drawing was for a 4 points.  Today a MOCA tests was performed here at the office, 29-30 points.  The patient feels that her cognitive side effects have been resolved she still has some feeling of inadequate  memory - The multitasking is especially troublesome. 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  The patient has by now 5-8 migraines a month, she reduced coffee, she has hydrated consciously. She has visual auras , nausea and vomiting. Photophobia and phonophobia.  The patient needs another prophylatic medication.  We will discuss beta blockers; she has low blood pressure , no asthma and no history of fainting. She would like to try that.  alternatives: ca channel blockers- not indicated for her kind of migraines.  , zonisamide, a possibility-  depakote - not because weight gain, and since this patient is of childbaring age.  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      REVIEW OF SYSTEMS: Full 14 system review of systems performed and notable only for those listed, all others are neg:  Constitutional: N/A  Cardiovascular: N/A  Ear/Nose/Throat: N/A  Skin: N/A  Eyes: N/A  Respiratory: N/A  Gastroitestinal: Abdominal pain  Hematology/Lymphatic: N/A  Endocrine: N/A Musculoskeletal:N/A  Allergy/Immunology: N/A  Neurological: N/A Psychiatric: N/A Sleep : Shift work  ALLERGIES: No Known Allergies  HOME MEDICATIONS: Outpatient Prescriptions Prior to Visit  Medication Sig Dispense Refill  . propranolol ER (INDERAL LA) 60 MG 24 hr capsule TAKE 1 CAPSULE BY MOUTH ONCE DAILY  30 capsule  5  . Multiple Vitamin (MULTIVITAMIN) capsule Take 1 capsule by mouth daily. occasionally      . SUMAtriptan (IMITREX)  100 MG tablet Take 1 tablet (100 mg total) by mouth as needed for migraine (May repeat in 2 hours if no relief.  Max 4 tabs weekly).  9 tablet  3   No facility-administered medications prior to visit.    PAST MEDICAL HISTORY: Past Medical History  Diagnosis Date  . Headaches, cluster   . Seizure disorder   . Seizures   . Migraine   . Kidney stones   . Calculus of kidney   . Attention deficit  disorder without mention of hyperactivity   . Other general symptoms(780.99)   . Migraine with aura 01/13/2013    PAST SURGICAL HISTORY: Past Surgical History  Procedure Laterality Date  . Wisdom tooth extraction  2002    FAMILY HISTORY: Family History  Problem Relation Age of Onset  . Cancer Other   . Heart failure Other   . Cancer Maternal Grandmother     breast  . Heart disease Maternal Grandfather   . Hypertension Maternal Grandfather     SOCIAL HISTORY: History   Social History  . Marital Status: Married    Spouse Name: Joey    Number of Children: N/A  . Years of Education: college   Occupational History  .  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 Smoking status: Never Smoker   . Smokeless tobacco: Never Used  . Alcohol Use: Yes     Comment: occasionally-2 drinks monthly  . Drug Use: No  . Sexual Activity: Yes    Birth Control/ Protection: IUD   Other Topics Concern  . Not on file   Social History Narrative   Patient consumes one cup of  caffeine daily. Patient is left handed, resides in home with husband, no children.     PHYSICAL EXAM  Filed Vitals:   08/28/13 1407  BP: 93/62  Pulse: 67  Height: 5\' 8"  (1.727 m)  Weight: 166 lb (75.297 kg)   Body mass index is 25.25 kg/(m^2).  Generalized: Well developed, in no acute distress, alert and appropriate  Cranial Nerves.Pupils were equal round reactive to light extraocular movements were full, visual field were full on confrontational test. Facial sensation and strength were normal. hearing was intact to finger rubbing bilaterally. Uvula tongue midline. head turning and shoulder shrug were normal and symmetric.Tongue protrusion into cheek strength was normal. Motor: normal bulk and tone, full strength in the BUE, BLE,  No focal weakness Coordination: finger-nose-finger, heel-to-shin bilaterally, no dysmetria Reflexes: Brachioradialis 2/2, biceps 2/2, triceps 2/2, patellar 2/2, Achilles  2/2, plantar responses were flexor bilaterally. Gait and Station: Rising up from seated position without assistance, normal stance,  moderate stride, good arm swing, smooth turning, able to perform tiptoe, and heel walking without difficulty. Tandem gait is steady  DIAGNOSTIC DATA (LABS, IMAGING, TESTING) -     ASSESSMENT AND PLAN  28 y.o. year old female  has a past medical history of Headaches,  and Migraine with aura (01/13/2013). here to followup. She has used Imitrex once in September of last year. Her headaches are well controlled with Inderal LA .  Continue Inderal LA at current dose Continue Imitrex acutely Followup in 6 months Nilda RiggsNancy Carolyn Martin, [facility] LLCGNP, [facility] CenterBC, APRN  [facility] [address], [address] [place], [place] [id number] [phone]

## 2013-08-31 NOTE — Progress Notes (Signed)
I agree with the assessment and plan as directed by NP .The patient is known to me .   Kendricks Reap, MD  

## 2014-03-01 ENCOUNTER — Ambulatory Visit (INDEPENDENT_AMBULATORY_CARE_PROVIDER_SITE_OTHER): Payer: 59 | Admitting: Nurse Practitioner

## 2014-03-01 ENCOUNTER — Encounter: Payer: Self-pay | Admitting: Nurse Practitioner

## 2014-03-01 VITALS — BP 85/60 | HR 58 | Temp 97.5°F | Resp 16 | Ht 68.0 in | Wt 164.6 lb

## 2014-03-01 DIAGNOSIS — G43109 Migraine with aura, not intractable, without status migrainosus: Secondary | ICD-10-CM

## 2014-03-01 MED ORDER — PROPRANOLOL HCL ER 60 MG PO CP24: 60.0000 mg | ORAL_CAPSULE | Freq: Every day | ORAL | Status: DC

## 2014-03-01 MED ORDER — SUMATRIPTAN SUCCINATE 100 MG PO TABS: 100.0000 mg | ORAL_TABLET | ORAL | Status: AC | PRN

## 2014-03-01 NOTE — Progress Notes (Signed)
[facility]  PATIENT: Tammy Cameron DOB: 02/20/1986   REASON FOR VISIT: follow-up for migraines   HISTORY OF PRESENT ILLNESS:Tammy Cameron, 28 year old female returns for followup. She was last seen in the office 08/28/2013. She has a history of migraines and is currently on Inderal LA 60 mg with excellent control of her headaches. She has not missed any work recently. She has not used  Imitrex since last seen.[place] and  would like to continue her care with [facility]. She is a traveling Engineer, civil (consulting)nurse. She is aware of her migraine triggers and tries to avoid those. She returns for reevaluation.  HISTORY: The patient had been treated with Topiramate for headche prophylaxis. The patient had a single Seizure at age 28 , with an abnormal EEG-  and the TPM was meant to help with that as well. Than the patient developed kidney stones, and the medication had to be discontinued. Than she has lost several days at work 22 migraines breaking through. She also noticed that the first month after being taken off topiramate was of fairly good and easy 1. The migraines seem to have staggered and have come in greater frequency and intensity of her symptoms as prophylactic medication was discontinued. There has been no other prophylactic prescribe sulfa but the patient has been taking Imitrex to treat the acute migraines. She sometimes needs to of Imitrex by mouth to control her migraines.  Plan the patient was treated with Topamax she also developed another well-known side effect, that of cognitive impairment. She felt that her short-term memory was impaired and she couldn't multitask but she wasn't able to pay as much attention felt at times confused.  Dr. Sandria ManlyLove performed a Mini-Mental test during his last visit in August 2013; the score was 29/30 points/ the animal fluency was 11 points/ clock drawing was for a 4 points.  Today a MOCA tests was performed here at the office, 29-30  points.  The patient feels that her cognitive side effects have been resolved she still has some feeling of inadequate memory - The multitasking is especially troublesome. 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  The patient has by now 5-8 migraines a month, she reduced coffee, she has hydrated consciously. She has visual auras , nausea and vomiting. Photophobia and phonophobia.  The patient needs another prophylatic medication.  We will discuss beta blockers; she has low blood pressure , no asthma and no history of fainting. She would like to try that.  alternatives: ca channel blockers- not indicated for her kind of migraines.  , zonisamide, a possibility-  depakote - not because weight gain, and since this patient is of childbaring age.  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     REVIEW OF SYSTEMS: Full 14 system review of systems performed and notable only for those listed, all others are neg:  Constitutional: N/A  Cardiovascular: N/A  Ear/Nose/Throat: N/A  Skin: N/A  Eyes: N/A  Respiratory: N/A  Gastroitestinal: N/A  Hematology/Lymphatic: N/A  Endocrine: N/A Musculoskeletal:N/A  Allergy/Immunology: N/A  Neurological: N/A Psychiatric: N/A Sleep : shift work  ALLERGIES: No Known Allergies  HOME MEDICATIONS: Outpatient Prescriptions Prior to Visit  Medication Sig Dispense Refill  . SUMAtriptan (IMITREX) 100 MG tablet Take 1 tablet (100 mg total) by mouth as needed for migraine (May repeat in 2 hours if no  relief.  Max 4 tabs weekly). 9 tablet 3  . Linaclotide (LINZESS) 145 MCG CAPS capsule Take 145 mcg by mouth as needed.    . Multiple Vitamin (MULTIVITAMIN) capsule Take 1 capsule by mouth daily. occasionally    . propranolol ER (INDERAL LA) 60 MG 24 hr capsule TAKE 1 CAPSULE BY MOUTH ONCE DAILY 30 capsule 5   No facility-administered medications prior to visit.    PAST  MEDICAL HISTORY: Past Medical History  Diagnosis Date  . Headaches, cluster   . Seizure disorder   . Seizures   . Migraine   . Kidney stones   . Calculus of kidney   . Attention deficit disorder without mention of hyperactivity   . Other general symptoms(780.99)   . Migraine with aura 01/13/2013    PAST SURGICAL HISTORY: Past Surgical History  Procedure Laterality Date  . Wisdom tooth extraction  2002    FAMILY HISTORY: Family History  Problem Relation Age of Onset  . Cancer Other   . Heart failure Other   . Cancer Maternal Grandmother     breast  . Heart disease Maternal Grandfather   . Hypertension Maternal Grandfather     SOCIAL HISTORY: History   Social History  . Marital Status: Married    Spouse Name: Joey    Number of Children: N/A  . Years of Education: college   Occupational History  .  Traveling nurse    Nurse   Social History Main Topics  . Smoking status: Never Smoker   . Smokeless tobacco: Never Used  . Alcohol Use: Yes     Comment: occasionally-2 drinks monthly  . Drug Use: No  . Sexual Activity: Yes    Birth Control/ Protection: IUD   Other Topics Concern  . Not on file   Social History Narrative   Patient consumes one cup of  caffeine daily. Patient is left handed, resides in home with husband, no children.     PHYSICAL EXAM  Filed Vitals:   03/01/14 1438  BP: 85/60  Pulse: 58  Temp: 97.5 F (36.4 C)  TempSrc: Oral  Resp: 16  Height: 5\' 8"  (1.727 m)  Weight: 164 lb 9.6 oz (74.662 kg)   Body mass index is 25.03 kg/(m^2). Generalized: Well developed, in no acute distress, alert and appropriate  Cranial Nerves.Pupils were equal round reactive to light extraocular movements were full, visual field were full on confrontational test. Facial sensation and strength were normal. hearing was intact to finger rubbing bilaterally. Uvula tongue midline. head turning and shoulder shrug were normal and symmetric.Tongue protrusion into cheek  strength was normal. Motor: normal bulk and tone, full strength in the BUE, BLE, No focal weakness Coordination: finger-nose-finger, heel-to-shin bilaterally, no dysmetria Reflexes: Brachioradialis 2/2, biceps 2/2, triceps 2/2, patellar 2/2, Achilles 2/2, plantar responses were flexor bilaterally. Gait and Station: Rising up from seated position without assistance, normal stance, moderate stride, good arm swing, smooth turning, able to perform tiptoe, and heel walking without difficulty. Tandem gait is steady  DIAGNOSTIC DATA (LABS, IMAGING, TESTING) - ASSESSMENT AND PLAN  28 y.o. year old female  has a past medical history of Headaches,  Migraine; here to follow-up. Her migraines are in excellent control with Inderal LA. She has not used any Imitrex in the last 6 months.  Continue Inderal at current dose will refill for one year RX to patient per request Refill for Imitrex to use acutely, RX to patient Follow-up yearly and when necessary Nilda RiggsNancy Carolyn Jamecia Lerman, GNP, BC,  APRN  [facility] [address] [place], [place] [address] [phone]

## 2014-03-01 NOTE — Progress Notes (Signed)
I agree with the assessment and plan as directed by NP .The patient is known to me .   Arriana Lohmann, MD  

## 2014-03-01 NOTE — Patient Instructions (Signed)
Continue Inderal at current dose will refill for one year Refill for Imitrex to use acutely Follow-up yearly and when necessary

## 2014-11-24 ENCOUNTER — Other Ambulatory Visit: Payer: Self-pay | Admitting: Nurse Practitioner

## 2015-03-02 ENCOUNTER — Ambulatory Visit: Payer: 59 | Admitting: Nurse Practitioner

## 2015-04-10 ENCOUNTER — Other Ambulatory Visit: Payer: Self-pay | Admitting: Nurse Practitioner

## 2023-12-06 ENCOUNTER — Other Ambulatory Visit: Payer: Self-pay | Admitting: Obstetrics and Gynecology

## 2023-12-06 DIAGNOSIS — R928 Other abnormal and inconclusive findings on diagnostic imaging of breast: Secondary | ICD-10-CM
